# Patient Record
Sex: Female | Born: 2005
Health system: Southern US, Community
[De-identification: ages and names within clinical notes are randomized; demographics above are authoritative.]

## PROBLEM LIST (undated history)

## (undated) DIAGNOSIS — J45909 Unspecified asthma, uncomplicated: Secondary | ICD-10-CM

## (undated) DIAGNOSIS — F909 Attention-deficit hyperactivity disorder, unspecified type: Secondary | ICD-10-CM

## (undated) HISTORY — PX: WISDOM TOOTH EXTRACTION: SHX21

---
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
## 2024-01-06 ENCOUNTER — Other Ambulatory Visit: Payer: Self-pay

## 2024-01-06 ENCOUNTER — Other Ambulatory Visit (HOSPITAL_BASED_OUTPATIENT_CLINIC_OR_DEPARTMENT_OTHER): Payer: Self-pay

## 2024-01-06 MED ORDER — "HYPODERMIC NEEDLE 25G X 5/8"" MISC"
1.0000 | 0 refills | Status: DC
  Filled 2024-01-06: qty 12, 84d supply, fill #0
  Filled 2024-03-30: qty 4, 28d supply, fill #1

## 2024-01-06 MED ORDER — TESTOSTERONE CYPIONATE 200 MG/ML IM SOLN
60.0000 mg | INTRAMUSCULAR | 0 refills | Status: DC
  Filled 2024-01-06: qty 4, 28d supply, fill #0
  Filled 2024-01-10: qty 12, 84d supply, fill #0

## 2024-01-06 MED ORDER — EASY TOUCH SYRINGE BARREL 1ML MISC
0 refills | Status: DC
  Filled 2024-01-06: qty 12, 84d supply, fill #0
  Filled 2024-01-10: qty 20, 90d supply, fill #0

## 2024-01-06 MED ORDER — "HYPODERMIC NEEDLE 18G X 1"" MISC"
1.0000 | 0 refills | Status: DC
  Filled 2024-01-06: qty 12, 84d supply, fill #0
  Filled 2024-01-10: qty 20, 90d supply, fill #0

## 2024-01-09 ENCOUNTER — Other Ambulatory Visit (HOSPITAL_COMMUNITY): Payer: Self-pay

## 2024-01-09 ENCOUNTER — Other Ambulatory Visit (HOSPITAL_BASED_OUTPATIENT_CLINIC_OR_DEPARTMENT_OTHER): Payer: Self-pay

## 2024-01-09 DIAGNOSIS — F649 Gender identity disorder, unspecified: Secondary | ICD-10-CM | POA: Diagnosis not present

## 2024-01-09 DIAGNOSIS — F640 Transsexualism: Secondary | ICD-10-CM | POA: Diagnosis not present

## 2024-01-10 ENCOUNTER — Other Ambulatory Visit (HOSPITAL_BASED_OUTPATIENT_CLINIC_OR_DEPARTMENT_OTHER): Payer: Self-pay

## 2024-01-10 ENCOUNTER — Other Ambulatory Visit: Payer: Self-pay

## 2024-01-10 DIAGNOSIS — F649 Gender identity disorder, unspecified: Secondary | ICD-10-CM | POA: Insufficient documentation

## 2024-03-30 ENCOUNTER — Other Ambulatory Visit: Payer: Self-pay

## 2024-03-30 ENCOUNTER — Other Ambulatory Visit (HOSPITAL_BASED_OUTPATIENT_CLINIC_OR_DEPARTMENT_OTHER): Payer: Self-pay

## 2024-04-06 ENCOUNTER — Other Ambulatory Visit (HOSPITAL_BASED_OUTPATIENT_CLINIC_OR_DEPARTMENT_OTHER): Payer: Self-pay

## 2024-04-06 ENCOUNTER — Ambulatory Visit: Admitting: Medical-Surgical

## 2024-04-06 MED ORDER — SYRINGE LUER SLIP 1 ML MISC
0 refills | Status: DC
  Filled 2024-06-09: qty 20, 20d supply, fill #0

## 2024-04-06 MED ORDER — HYPODERMIC NEEDLE 25G X 5/8" MISC
0 refills | Status: AC
  Filled 2024-04-06: qty 20, 140d supply, fill #0

## 2024-04-06 MED ORDER — BD LUER-LOK SYRINGE 18G X 1-1/2" 3 ML MISC
0 refills | Status: AC
  Filled 2024-04-06: qty 20, 140d supply, fill #0

## 2024-04-06 MED ORDER — TESTOSTERONE CYPIONATE 200 MG/ML IM SOLN
60.0000 mg | INTRAMUSCULAR | 0 refills | Status: AC
  Filled 2024-04-06: qty 4, 28d supply, fill #0
  Filled 2024-09-21: qty 4, 28d supply, fill #1

## 2024-04-10 ENCOUNTER — Encounter: Payer: Self-pay | Admitting: Medical-Surgical

## 2024-04-10 ENCOUNTER — Ambulatory Visit: Admitting: Medical-Surgical

## 2024-04-10 VITALS — BP 114/72 | HR 79 | Resp 20 | Ht 68.05 in | Wt 235.0 lb

## 2024-04-10 DIAGNOSIS — M2550 Pain in unspecified joint: Secondary | ICD-10-CM | POA: Diagnosis not present

## 2024-04-10 DIAGNOSIS — M791 Myalgia, unspecified site: Secondary | ICD-10-CM | POA: Diagnosis not present

## 2024-04-10 NOTE — Progress Notes (Unsigned)
   Established patient visit  History, exam, impression, and plan:  1. Cough, unspecified type 2. Sore throat Pleasant 18 year old female presenting today with reports of upper respiratory symptoms.  Notes that this started approximately 5 days ago with her nose and eyes burning.  On Saturday, she felt unwell and by Sunday she notes that she felt awful.  Her throat has been sore and she has been coughing.  Notes that her cough has been occasionally productive of small amounts of pink-tinged sputum, usually in the morning.  Her left ear has now developed pressure/discomfort and she has significant sinus congestion.  She has tried drinking hot tea and increasing her fluid consumption.  She has also used Chloraseptic for the sore throat.  Continues to have significant issues with hoarseness.  She saw ENT and they recommended just using Atrovent nasal spray twice daily and follow-up with them after a couple of months.  POCT strep, flu, and COVID testing all negative today.  Below for physical exam. - POCT rapid strep A - POCT Influenza A/B - POC COVID-19  3. Viral URI with cough Despite negative testing, suspect that her symptoms are truly related to a viral URI.  Discussed the timeline for resolution of a viral illness since symptoms can last 7 to 14 days.  With her severe hoarseness and significant sinus congestion, treating with Decadron 4 mg twice daily.  Adding Tussionex for cough suppression.  Okay to use Tylenol/ibuprofen for fever/discomfort.  Continue conservative measures at home.  If improvement in symptoms not noted over the next 2 to 3 days or symptoms improved but quickly worsen again, consider adding antibiotic therapy for secondary bacterial infection.   Procedures performed this visit: None.  Return if symptoms worsen or fail to improve.  __________________________________ Thayer Ohm, DNP, APRN, FNP-BC Primary Care and Sports Medicine [facility] [place]

## 2024-04-11 ENCOUNTER — Ambulatory Visit: Payer: Self-pay | Admitting: Medical-Surgical

## 2024-04-12 ENCOUNTER — Encounter: Payer: Self-pay | Admitting: Medical-Surgical

## 2024-04-12 LAB — C-REACTIVE PROTEIN: CRP: <1 mg/L (ref 0–10)

## 2024-04-12 LAB — SEDIMENTATION RATE: Sed Rate: 15 mm/h (ref 0–32)

## 2024-04-12 LAB — CYCLIC CITRUL PEPTIDE ANTIBODY, IGG/IGA: Cyclic Citrullin Peptide Ab: 16 U (ref 0–19)

## 2024-04-12 LAB — RHEUMATOID FACTOR: Rheumatoid fact SerPl-aCnc: <10 [IU]/mL (ref <14.0)

## 2024-04-12 LAB — ANA: Anti Nuclear Antibody (ANA): NEGATIVE

## 2024-05-07 DIAGNOSIS — Z79899 Other long term (current) drug therapy: Secondary | ICD-10-CM | POA: Diagnosis not present

## 2024-05-07 DIAGNOSIS — F640 Transsexualism: Secondary | ICD-10-CM | POA: Diagnosis not present

## 2024-05-07 DIAGNOSIS — F649 Gender identity disorder, unspecified: Secondary | ICD-10-CM | POA: Diagnosis not present

## 2024-05-07 DIAGNOSIS — J45909 Unspecified asthma, uncomplicated: Secondary | ICD-10-CM | POA: Diagnosis not present

## 2024-05-07 DIAGNOSIS — E669 Obesity, unspecified: Secondary | ICD-10-CM | POA: Diagnosis not present

## 2024-05-07 DIAGNOSIS — Z79890 Hormone replacement therapy (postmenopausal): Secondary | ICD-10-CM | POA: Diagnosis not present

## 2024-05-07 DIAGNOSIS — Z6835 Body mass index (BMI) 35.0-35.9, adult: Secondary | ICD-10-CM | POA: Diagnosis not present

## 2024-06-09 ENCOUNTER — Other Ambulatory Visit (HOSPITAL_COMMUNITY): Payer: Self-pay

## 2024-06-11 ENCOUNTER — Other Ambulatory Visit (HOSPITAL_COMMUNITY): Payer: Self-pay

## 2024-06-11 ENCOUNTER — Other Ambulatory Visit: Payer: Self-pay

## 2024-06-16 ENCOUNTER — Other Ambulatory Visit (HOSPITAL_COMMUNITY): Payer: Self-pay

## 2024-06-28 ENCOUNTER — Ambulatory Visit: Payer: Commercial Managed Care - PPO | Admitting: Medical-Surgical

## 2024-07-02 ENCOUNTER — Ambulatory Visit: Admitting: Medical-Surgical

## 2024-07-02 ENCOUNTER — Other Ambulatory Visit (HOSPITAL_BASED_OUTPATIENT_CLINIC_OR_DEPARTMENT_OTHER): Payer: Self-pay

## 2024-07-02 ENCOUNTER — Encounter: Payer: Self-pay | Admitting: Medical-Surgical

## 2024-07-02 VITALS — BP 113/67 | HR 78 | Resp 20 | Ht 68.06 in | Wt 233.0 lb

## 2024-07-02 DIAGNOSIS — F419 Anxiety disorder, unspecified: Secondary | ICD-10-CM

## 2024-07-02 MED ORDER — SERTRALINE HCL 50 MG PO TABS
50.0000 mg | ORAL_TABLET | Freq: Every day | ORAL | 3 refills | Status: AC
  Filled 2024-07-02: qty 90, 90d supply, fill #0
  Filled 2024-10-19: qty 90, 90d supply, fill #1

## 2024-07-02 NOTE — Progress Notes (Signed)
        Established patient visit  History, exam, impression, and plan:  1. Anxiety (Primary) Very pleasant 18 year old transgender female presenting today with a history of gender dysphoria and anxiety.  Taking sertraline  50 mg daily, tolerating well without side effects.  Has been on this regimen for a while and notes that symptoms are very well-managed.  No mood swings, SI, or HI.  PHQ-9/GAD-7 scores less than 4 showing excellent control of mood symptoms.  Mood, affect, speech, thought pattern, and cognition all normal during appointment.  Refilling sertraline  50 mg daily. - sertraline  (ZOLOFT ) 50 MG tablet; Take 1 tablet (50 mg total) by mouth daily.  Dispense: 90 tablet; Refill: 3   Procedures performed this visit: None.  Return in about 6 months (around 01/02/2025) for mood/GAHT follow up.  __________________________________ Yvonne FREDRIK Palin, DNP, APRN, FNP-BC Primary Care and Sports Medicine [facility] [place]

## 2024-07-06 ENCOUNTER — Other Ambulatory Visit: Payer: Self-pay

## 2024-07-06 ENCOUNTER — Other Ambulatory Visit (HOSPITAL_BASED_OUTPATIENT_CLINIC_OR_DEPARTMENT_OTHER): Payer: Self-pay

## 2024-07-06 MED ORDER — "HYPODERMIC NEEDLE 18G X 1"" MISC"
0 refills | Status: DC
  Filled 2024-07-06: qty 20, 90d supply, fill #0

## 2024-07-06 MED ORDER — SYRINGE LUER SLIP 1 ML MISC
0 refills | Status: DC
  Filled 2024-07-06 (×2): qty 20, 20d supply, fill #0

## 2024-07-06 MED ORDER — TESTOSTERONE CYPIONATE 200 MG/ML IM SOLN
INTRAMUSCULAR | 0 refills | Status: AC
  Filled 2024-07-06: qty 4, 28d supply, fill #0

## 2024-07-06 MED ORDER — "HYPODERMIC NEEDLE 25G X 5/8"" MISC"
0 refills | Status: DC
  Filled 2024-07-06: qty 20, 140d supply, fill #0

## 2024-07-31 ENCOUNTER — Encounter: Payer: Self-pay | Admitting: Sports Medicine

## 2024-09-21 ENCOUNTER — Other Ambulatory Visit: Payer: Self-pay

## 2024-09-28 ENCOUNTER — Other Ambulatory Visit (HOSPITAL_BASED_OUTPATIENT_CLINIC_OR_DEPARTMENT_OTHER): Payer: Self-pay

## 2024-10-04 ENCOUNTER — Other Ambulatory Visit (HOSPITAL_BASED_OUTPATIENT_CLINIC_OR_DEPARTMENT_OTHER): Payer: Self-pay

## 2024-10-04 MED ORDER — TESTOSTERONE CYPIONATE 200 MG/ML IM SOLN: INTRAMUSCULAR | 0 refills | Status: DC

## 2024-12-05 ENCOUNTER — Other Ambulatory Visit (HOSPITAL_BASED_OUTPATIENT_CLINIC_OR_DEPARTMENT_OTHER): Payer: Self-pay

## 2024-12-06 ENCOUNTER — Other Ambulatory Visit (HOSPITAL_BASED_OUTPATIENT_CLINIC_OR_DEPARTMENT_OTHER): Payer: Self-pay

## 2024-12-06 ENCOUNTER — Other Ambulatory Visit: Payer: Self-pay

## 2024-12-06 MED ORDER — "BD DISP NEEDLES 25G X 5/8"" MISC"
0 refills | Status: AC
  Filled 2024-12-06: qty 20, 140d supply, fill #0

## 2024-12-06 MED ORDER — BD SYRINGE LUER-LOK 1 ML MISC
0 refills | Status: DC
  Filled 2024-12-06: qty 20, 140d supply, fill #0

## 2024-12-06 MED ORDER — "BD HYPODERMIC NEEDLE 18G X 1"" MISC"
0 refills | Status: AC
  Filled 2024-12-06: qty 20, 140d supply, fill #0

## 2024-12-17 ENCOUNTER — Other Ambulatory Visit (HOSPITAL_BASED_OUTPATIENT_CLINIC_OR_DEPARTMENT_OTHER): Payer: Self-pay

## 2024-12-31 ENCOUNTER — Encounter: Payer: Commercial Managed Care - PPO | Admitting: Medical-Surgical

## 2025-01-04 ENCOUNTER — Other Ambulatory Visit (HOSPITAL_COMMUNITY): Payer: Self-pay

## 2025-01-04 MED ORDER — "HYPODERMIC NEEDLE 18G X 1"" MISC"
0 refills | Status: AC
  Filled 2025-01-04: qty 20, 84d supply, fill #0

## 2025-01-04 MED ORDER — SYRINGE LUER SLIP 1 ML MISC
0 refills | Status: AC
  Filled 2025-01-04: qty 20, 84d supply, fill #0

## 2025-01-04 MED ORDER — "HYPODERMIC NEEDLE 25G X 5/8"" MISC"
0 refills | Status: AC
  Filled 2025-01-04: qty 20, 84d supply, fill #0

## 2025-01-04 MED ORDER — TESTOSTERONE CYPIONATE 200 MG/ML IM SOLN
80.0000 mg | INTRAMUSCULAR | 1 refills | Status: AC
  Filled 2025-01-04: qty 12, 84d supply, fill #0

## 2025-01-14 ENCOUNTER — Encounter: Admitting: Medical-Surgical
